# Patient Record
Sex: Male | Born: 1965 | Race: White | Hispanic: No | Marital: Married | Smoking: Never smoker
Health system: Southern US, Community
[De-identification: ages and names within clinical notes are randomized; demographics above are authoritative.]

## PROBLEM LIST (undated history)

## (undated) DIAGNOSIS — M109 Gout, unspecified: Secondary | ICD-10-CM

---

## 2002-03-16 ENCOUNTER — Emergency Department (HOSPITAL_COMMUNITY): Admission: EM | Admit: 2002-03-16 | Discharge: 2002-03-17 | Payer: Self-pay | Admitting: Emergency Medicine

## 2003-02-17 ENCOUNTER — Emergency Department (HOSPITAL_COMMUNITY): Admission: EM | Admit: 2003-02-17 | Discharge: 2003-02-18 | Payer: Self-pay | Admitting: Emergency Medicine

## 2003-02-18 ENCOUNTER — Encounter: Payer: Self-pay | Admitting: Emergency Medicine

## 2008-06-03 ENCOUNTER — Encounter: Admission: RE | Admit: 2008-06-03 | Discharge: 2008-06-03 | Payer: Self-pay | Admitting: Family Medicine

## 2010-06-20 IMAGING — CR DG RIBS 2V*L*
1 series · 4 of 4 positions shown · non-contrast
Comparison: None

CLINICAL DATA: Fall, left rib and sternal pain.

LEFT RIBS - 2 VIEW

[Series 1: view not recorded · 0.17mm/px · 4 of 4 slices shown]
[im 1/4]
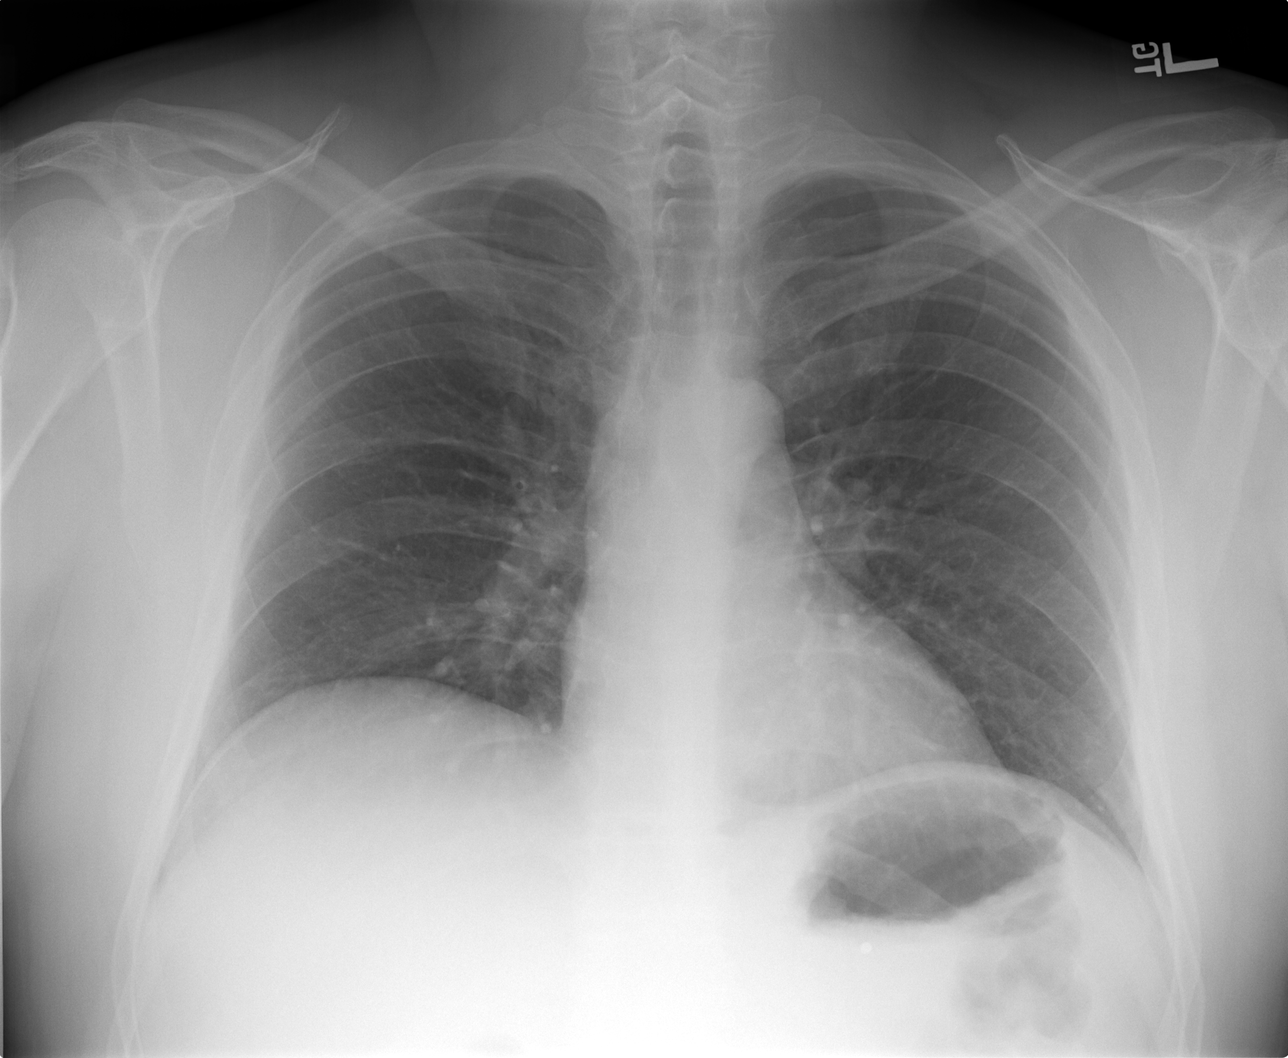
[im 2/4]
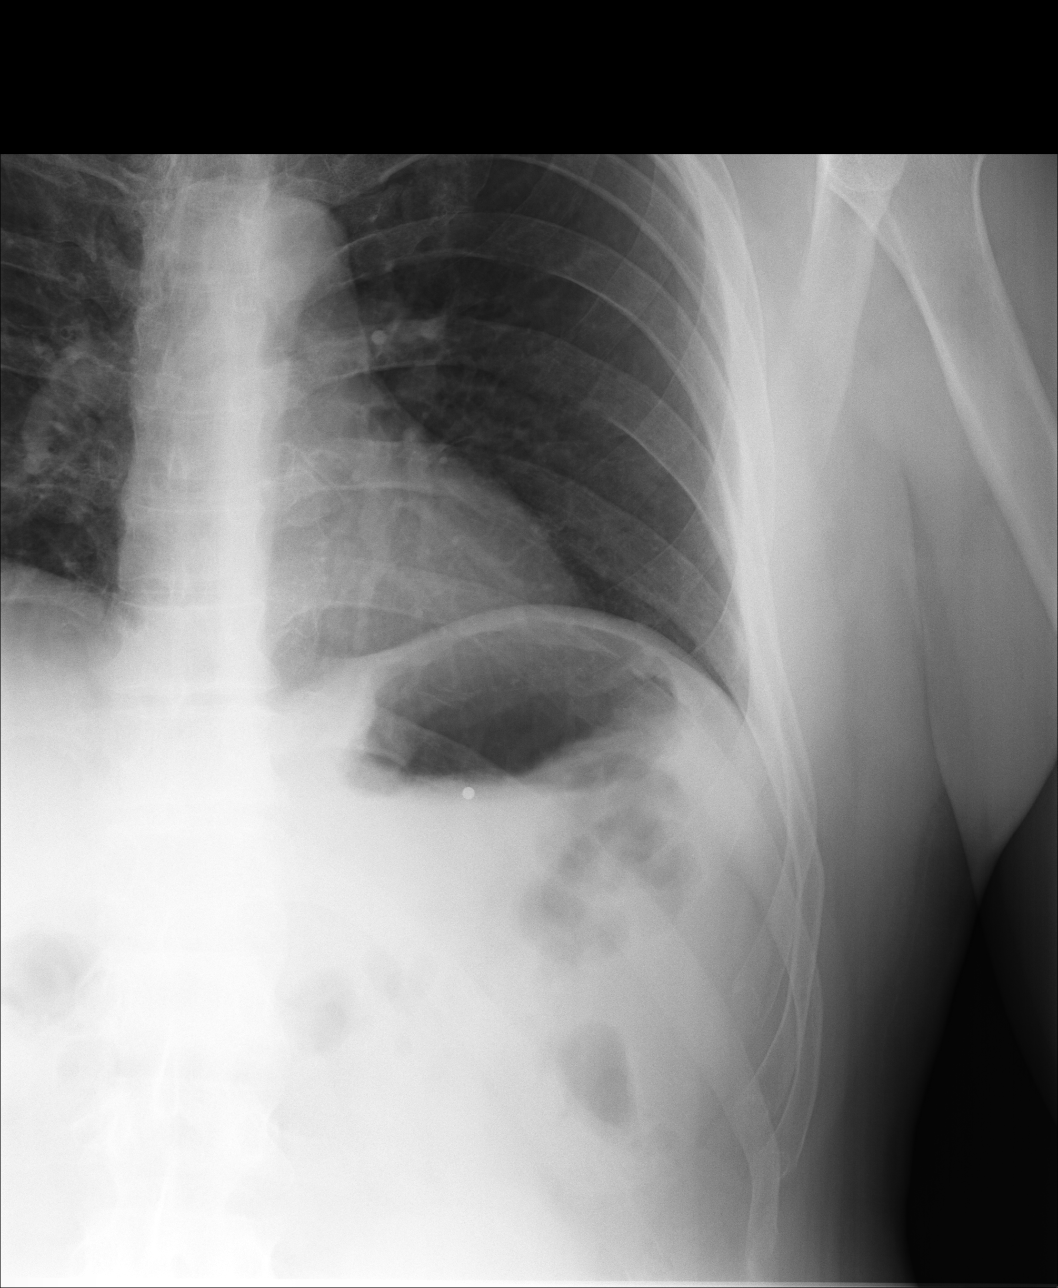
[im 3/4]
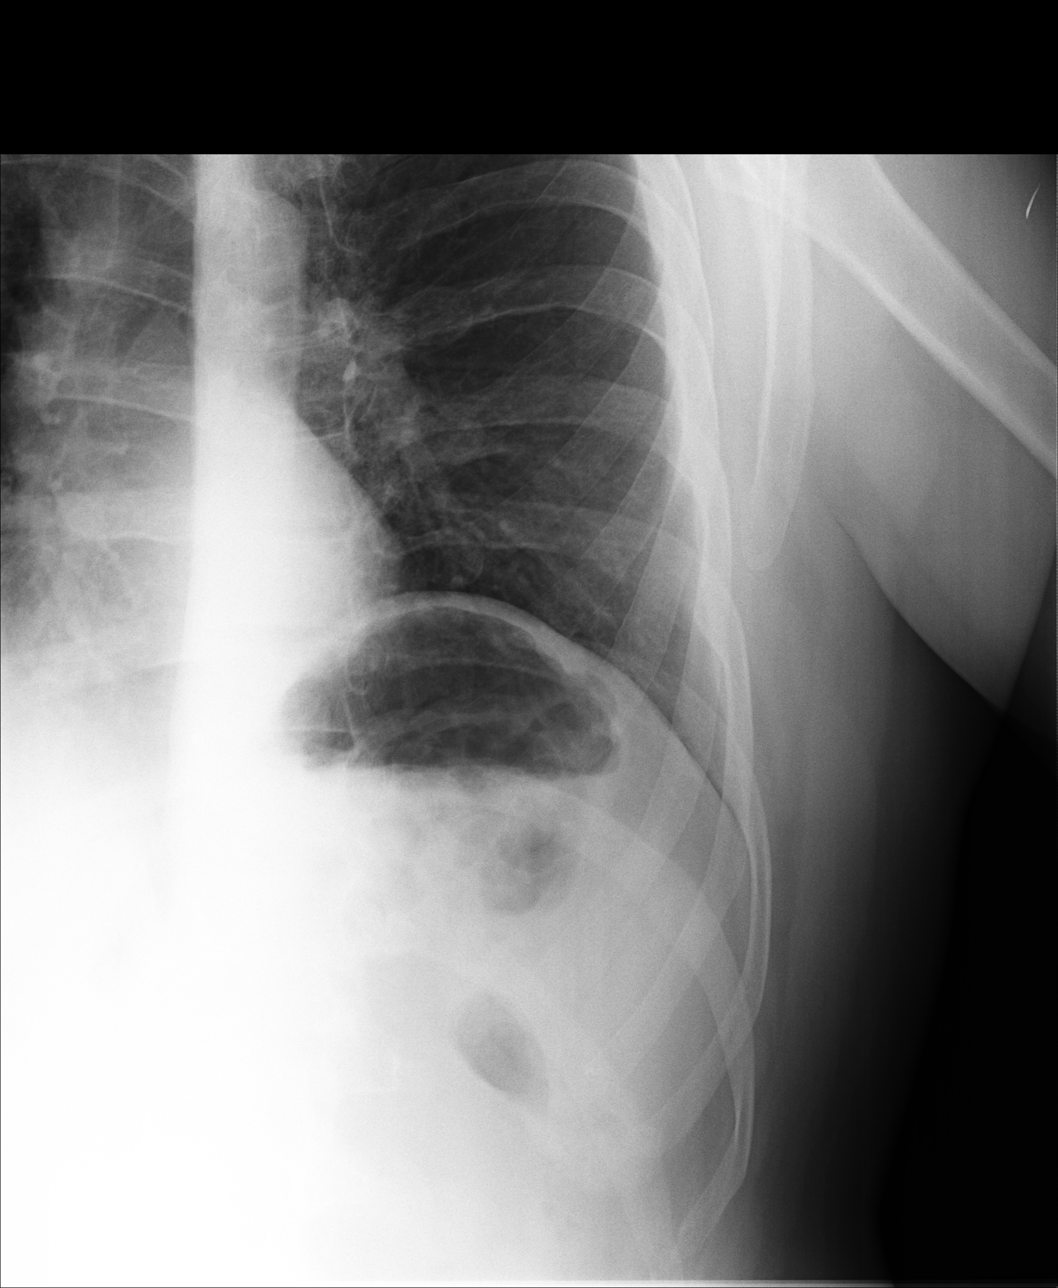
[im 4/4]
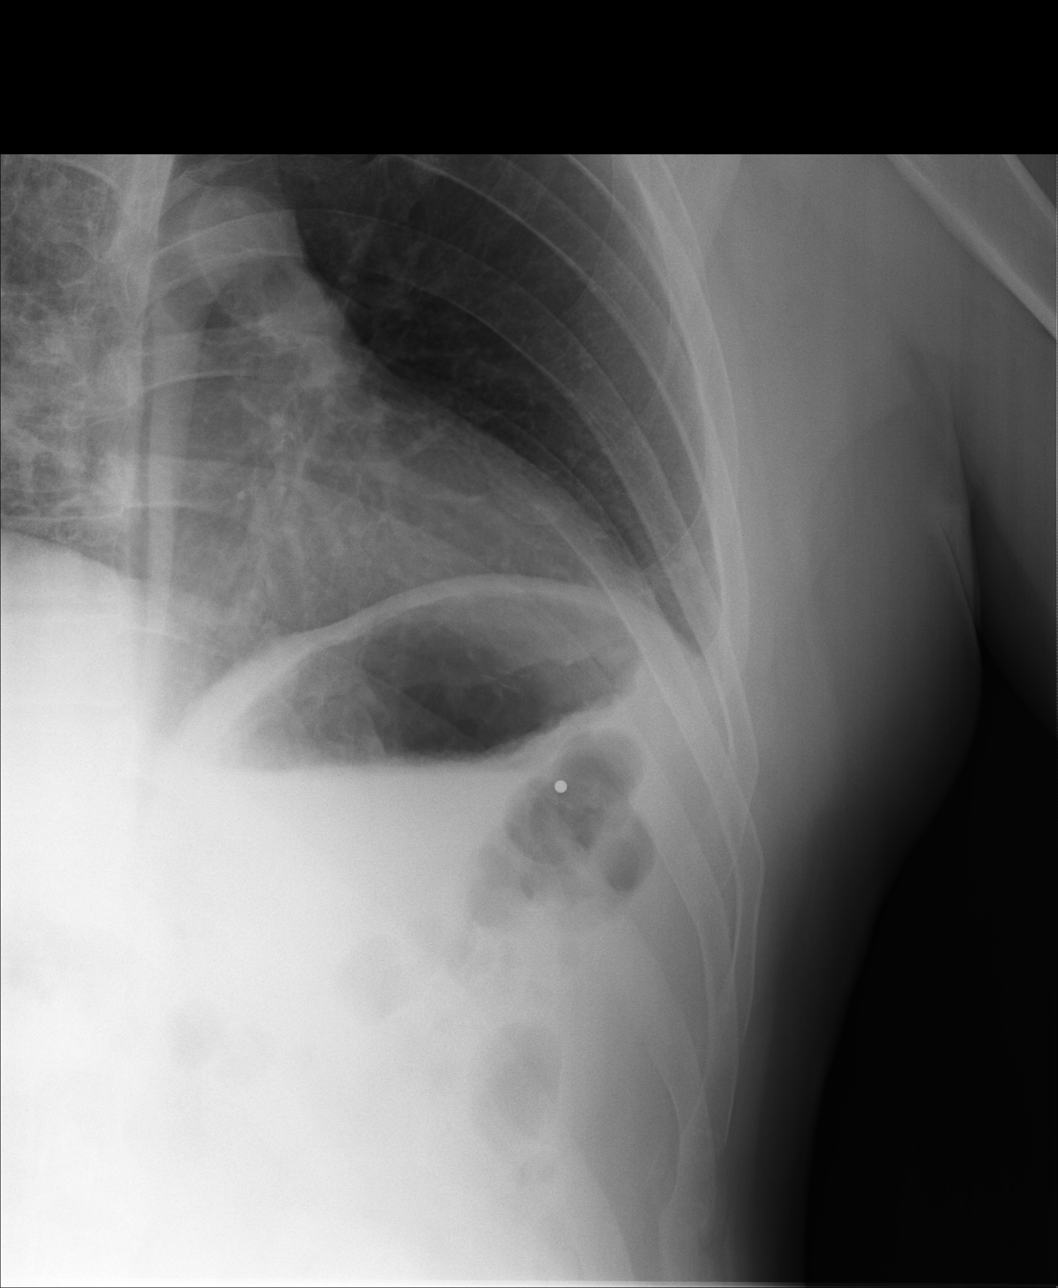

[4 of 4 positions shown; findings below may reference images not displayed]

FINDINGS: No acute bony abnormality.  Specifically no evidence of
left rib fracture.  Heart is normal size.  Lungs are clear.  No
pneumothorax or effusions.
IMPRESSION: No acute findings.

REF:G5 DICTATED: 06/03/2008 [DATE]
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
## 2014-12-10 ENCOUNTER — Emergency Department (HOSPITAL_BASED_OUTPATIENT_CLINIC_OR_DEPARTMENT_OTHER): Payer: BLUE CROSS/BLUE SHIELD

## 2014-12-10 ENCOUNTER — Encounter (HOSPITAL_BASED_OUTPATIENT_CLINIC_OR_DEPARTMENT_OTHER): Payer: Self-pay | Admitting: *Deleted

## 2014-12-10 ENCOUNTER — Emergency Department (HOSPITAL_BASED_OUTPATIENT_CLINIC_OR_DEPARTMENT_OTHER)
Admission: EM | Admit: 2014-12-10 | Discharge: 2014-12-10 | Disposition: A | Discharge status: Home / Self Care | Payer: BLUE CROSS/BLUE SHIELD | Attending: Emergency Medicine | Admitting: Emergency Medicine

## 2014-12-10 DIAGNOSIS — M109 Gout, unspecified: Secondary | ICD-10-CM | POA: Diagnosis not present

## 2014-12-10 DIAGNOSIS — R109 Unspecified abdominal pain: Secondary | ICD-10-CM | POA: Diagnosis present

## 2014-12-10 HISTORY — DX: Gout, unspecified: M10.9

## 2014-12-10 LAB — URINALYSIS, ROUTINE W REFLEX MICROSCOPIC
Bilirubin Urine: NEGATIVE
Glucose, UA: NEGATIVE mg/dL
Hgb urine dipstick: NEGATIVE
Ketones, ur: NEGATIVE mg/dL
Leukocytes, UA: NEGATIVE
Nitrite: NEGATIVE
Protein, ur: NEGATIVE mg/dL
Specific Gravity, Urine: 1.018 (ref 1.005–1.030)
Urobilinogen, UA: 1 mg/dL (ref 0.0–1.0)
pH: 6 (ref 5.0–8.0)

## 2014-12-10 LAB — COMPREHENSIVE METABOLIC PANEL
ALT: 286 U/L — ABNORMAL HIGH (ref 17–63)
AST: 90 U/L — ABNORMAL HIGH (ref 15–41)
Albumin: 3.4 g/dL — ABNORMAL LOW (ref 3.5–5.0)
Alkaline Phosphatase: 175 U/L — ABNORMAL HIGH (ref 38–126)
Anion gap: 8 (ref 5–15)
BUN: 16 mg/dL (ref 6–20)
CO2: 27 mmol/L (ref 22–32)
Calcium: 8.9 mg/dL (ref 8.9–10.3)
Chloride: 107 mmol/L (ref 101–111)
Creatinine, Ser: 0.98 mg/dL (ref 0.61–1.24)
GFR calc Af Amer: >60 mL/min (ref >60)
GFR calc non Af Amer: >60 mL/min (ref >60)
Glucose, Bld: 83 mg/dL (ref 65–99)
Potassium: 3.9 mmol/L (ref 3.5–5.1)
Sodium: 142 mmol/L (ref 135–145)
Total Bilirubin: 0.5 mg/dL (ref 0.3–1.2)
Total Protein: 6.5 g/dL (ref 6.5–8.1)

## 2014-12-10 NOTE — ED Notes (Signed)
Right flank pain x 3 days. No hx of kidney stones. Was seen last week with fever and may have lymes disease but states he is not here for that.

## 2014-12-10 NOTE — Discharge Instructions (Signed)
Flank Pain Your liver function tests are: AST-90 ALT-286 Alkaline phosphatase- 175. It is okay to take ibuprofen or Advil as directed for pain. Keep your scheduled appointment with at Dr Johnathan Hausen office next week. Continue doxycycline as prescribed.  Flank pain refers to pain that is located on the side of the body between the upper abdomen and the back. The pain may occur over a short period of time (acute) or may be long-term or reoccurring (chronic). It may be mild or severe. Flank pain can be caused by many things. CAUSES  Some of the more common causes of flank pain include:  Muscle strains.   Muscle spasms.   A disease of your spine (vertebral disk disease).   A lung infection (pneumonia).   Fluid around your lungs (pulmonary edema).   A kidney infection.   Kidney stones.   A very painful skin rash caused by the chickenpox virus (shingles).   Gallbladder disease.  HOME CARE INSTRUCTIONS  Home care will depend on the cause of your pain. In general,  Rest as directed by your caregiver.  Drink enough fluids to keep your urine clear or pale yellow.  Only take over-the-counter or prescription medicines as directed by your caregiver. Some medicines may help relieve the pain.  Tell your caregiver about any changes in your pain.  Follow up with your caregiver as directed. SEEK IMMEDIATE MEDICAL CARE IF:   Your pain is not controlled with medicine.   You have new or worsening symptoms.  Your pain increases.   You have abdominal pain.   You have shortness of breath.   You have persistent nausea or vomiting.   You have swelling in your abdomen.   You feel faint or pass out.   You have blood in your urine.  You have a fever or persistent symptoms for more than 2-3 days.  You have a fever and your symptoms suddenly get worse. MAKE SURE YOU:   Understand these instructions.  Will watch your condition.  Will get help right away if you are  not doing well or get worse. Document Released: 06/07/2005 Document Revised: 01/09/2012 Document Reviewed: 11/29/2011 [facility] Patient Information 2015 [place], [place]. This information is not intended to replace advice given to you by your health care provider. Make sure you discuss any questions you have with your health care provider.

## 2014-12-10 NOTE — ED Provider Notes (Addendum)
CSN: [id number]     Arrival date & time 12/10/14  1657 History  This chart was scribed for Doug Sou, MD by Placido Sou, ED scribe. This patient was seen in room MH10/MH10 and the patient's care was started at 6:05 PM.   Chief Complaint  Patient presents with  . Flank Pain   HPI  HPI Comments: Randall Ayala is a 49 y.o. male who presents to the Emergency Department complaining of constant, moderate, right flank pain with onset 2 days ago. Pt notes his pain worsens while lying and sleeping but denies any worsening of his pain when changing positions, ambulating or eating. He rates his current pain as 2/10 and describes it as dull. Pt also notes taking ibuprofen for pain management and experiencing some relief. He notes associated trouble sleeping. He notes that he was seen 6 days ago for fever (TMAX 103), chills, a rash to his left medial thigh, and body aches and further notes that those symptoms subsided about 4 days ago before the onset of his current flank pain. Pt notes that he has been taking his prescribed doxycycline since his previous visit which has helped alleviate his rash which started last week. Patient is treated with doxycycline for suspected Lyme's disease. Rash is improved markedly with time. He showed me a picture from his cell phone which was typical of erythema chronica migrans He denies any abd pain. No other associated symptoms.  Past Medical History  Diagnosis Date  . Gout    History reviewed. No pertinent past surgical history. No family history on file. Social History  Substance Use Topics  . Smoking status: Never Smoker   . Smokeless tobacco: None  . Alcohol Use: No    Review of Systems  Constitutional: Negative.   HENT: Negative.   Respiratory: Negative.   Cardiovascular: Negative.   Gastrointestinal: Negative.   Genitourinary: Positive for flank pain.  Musculoskeletal: Negative.   Skin: Positive for rash.  Neurological: Negative.    Psychiatric/Behavioral: Negative.   All other systems reviewed and are negative.   Allergies  Review of patient's allergies indicates no known allergies.  Home Medications   Prior to Admission medications   Medication Sig Start Date End Date Taking? Authorizing Provider  ALLOPURINOL PO Take by mouth.   Yes Historical Provider, MD   BP 124/84 mmHg  Pulse 58  Temp(Src) 98 F (36.7 C) (Oral)  Resp 18  Ht 6\' 2"  (1.88 m)  Wt 230 lb (104.327 kg)  BMI 29.52 kg/m2  SpO2 99% Physical Exam  Constitutional: He appears well-developed and well-nourished.  HENT:  Head: Normocephalic and atraumatic.  Eyes: Conjunctivae are normal. Pupils are equal, round, and reactive to light.  Neck: Neck supple. No tracheal deviation present. No thyromegaly present.  Cardiovascular: Normal rate and regular rhythm.   No murmur heard. Pulmonary/Chest: Effort normal and breath sounds normal.  Abdominal: Soft. Bowel sounds are normal. He exhibits no distension. There is no tenderness.  Musculoskeletal: Normal range of motion. He exhibits no edema or tenderness.  Neurological: He is alert. Coordination normal.  Skin: Skin is warm and dry. No rash noted.  Pinkish 20 cm area at left medial thigh. Not warm or tender or raised  Psychiatric: He has a normal mood and affect.  Nursing note and vitals reviewed.   ED Course  Procedures  DIAGNOSTIC STUDIES: Oxygen Saturation is 99% on RA, normal by my interpretation.    COORDINATION OF CARE: 6:17 PM Discussed treatment plan with pt at bedside and  pt agreed to plan.  Labs Review Labs Reviewed  URINALYSIS, ROUTINE W REFLEX MICROSCOPIC (NOT AT [facility])    Imaging Review No results found. I, Placido Sou, personally reviewed and evaluated these images and lab results as part of my medical decision-making.   EKG Interpretation None     Declines pain medicine  7:55 PM patient resting comfortably. Results for orders placed or performed during the  hospital encounter of 12/10/14  Urinalysis, Routine w reflex microscopic (not at [facility])  Result Value Ref Range   Color, Urine YELLOW YELLOW   APPearance CLEAR CLEAR   Specific Gravity, Urine 1.018 1.005 - 1.030   pH 6.0 5.0 - 8.0   Glucose, UA NEGATIVE NEGATIVE mg/dL   Hgb urine dipstick NEGATIVE NEGATIVE   Bilirubin Urine NEGATIVE NEGATIVE   Ketones, ur NEGATIVE NEGATIVE mg/dL   Protein, ur NEGATIVE NEGATIVE mg/dL   Urobilinogen, UA 1.0 0.0 - 1.0 mg/dL   Nitrite NEGATIVE NEGATIVE   Leukocytes, UA NEGATIVE NEGATIVE  Comprehensive metabolic panel  Result Value Ref Range   Sodium 142 135 - 145 mmol/L   Potassium 3.9 3.5 - 5.1 mmol/L   Chloride 107 101 - 111 mmol/L   CO2 27 22 - 32 mmol/L   Glucose, Bld 83 65 - 99 mg/dL   BUN 16 6 - 20 mg/dL   Creatinine, Ser 1.61 0.61 - 1.24 mg/dL   Calcium 8.9 8.9 - 09.6 mg/dL   Total Protein 6.5 6.5 - 8.1 g/dL   Albumin 3.4 (L) 3.5 - 5.0 g/dL   AST 90 (H) 15 - 41 U/L   ALT 286 (H) 17 - 63 U/L   Alkaline Phosphatase 175 (H) 38 - 126 U/L   Total Bilirubin 0.5 0.3 - 1.2 mg/dL   GFR calc non Af Amer >60 >60 mL/min   GFR calc Af Amer >60 >60 mL/min   Anion gap 8 5 - 15   Ct Renal Forsee Study  12/10/2014   CLINICAL DATA:  Acute right-sided flank pain.  EXAM: CT ABDOMEN AND PELVIS WITHOUT CONTRAST  TECHNIQUE: Multidetector CT imaging of the abdomen and pelvis was performed following the standard protocol without IV contrast.  COMPARISON:  None.  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 Visualized lung bases appear normal.  Cholelithiasis is noted. No focal abnormality is noted in the liver, spleen or pancreas on these unenhanced images. Adrenal glands appear normal. No hydronephrosis or renal obstruction is noted. No renal or ureteral calculi are noted. The appendix appears normal. Diverticulosis of descending and sigmoid colon is noted without inflammation. No abnormal fluid collection is noted. Urinary bladder appears normal. No significant  adenopathy is noted.  IMPRESSION: Cholelithiasis without definite evidence of inflammation.  No hydronephrosis or renal obstruction is noted.  Diverticulosis of descending and sigmoid colon is noted without inflammation.   Electronically Signed   By: Lupita Raider, M.D.   On: 12/10/2014 19:26    MDM  I suspect the patient had Lyme disease which is appropriately treated with doxycycline. No evidence of acute cholecystitis. Plan ibuprofen for pain. Keep scheduled appointment at Dr.Harris's office next week Diagnoses #1 right flank pain #2 elevated liver function tests #3 cholelithiasis Final diagnoses:  None      The patient's paper medical record is not available during this visit. It has been removed from this office and cannot be located.   Doug Sou, MD 12/10/14 Willette Alma, MD 12/10/14 [phone]

## 2014-12-10 NOTE — ED Notes (Signed)
MD at bedside. 

## 2016-01-18 ENCOUNTER — Ambulatory Visit (HOSPITAL_BASED_OUTPATIENT_CLINIC_OR_DEPARTMENT_OTHER): Admit: 2016-01-18 | Payer: BLUE CROSS/BLUE SHIELD | Admitting: Orthopedic Surgery

## 2016-01-18 ENCOUNTER — Encounter (HOSPITAL_BASED_OUTPATIENT_CLINIC_OR_DEPARTMENT_OTHER): Payer: Self-pay

## 2016-01-18 SURGERY — ARTHROSCOPY, KNEE, WITH MENISCUS REPAIR
Anesthesia: General | Site: Knee | Laterality: Left

## 2016-05-07 DIAGNOSIS — M159 Polyosteoarthritis, unspecified: Secondary | ICD-10-CM | POA: Diagnosis not present

## 2016-05-07 DIAGNOSIS — Z87891 Personal history of nicotine dependence: Secondary | ICD-10-CM | POA: Diagnosis not present

## 2016-05-07 DIAGNOSIS — Z79899 Other long term (current) drug therapy: Secondary | ICD-10-CM | POA: Diagnosis not present

## 2016-05-07 DIAGNOSIS — M1A09X0 Idiopathic chronic gout, multiple sites, without tophus (tophi): Secondary | ICD-10-CM | POA: Diagnosis not present

## 2016-11-12 DIAGNOSIS — Z87891 Personal history of nicotine dependence: Secondary | ICD-10-CM | POA: Diagnosis not present

## 2016-11-12 DIAGNOSIS — Z79899 Other long term (current) drug therapy: Secondary | ICD-10-CM | POA: Diagnosis not present

## 2016-11-12 DIAGNOSIS — M159 Polyosteoarthritis, unspecified: Secondary | ICD-10-CM | POA: Diagnosis not present

## 2016-11-12 DIAGNOSIS — M1A09X0 Idiopathic chronic gout, multiple sites, without tophus (tophi): Secondary | ICD-10-CM | POA: Diagnosis not present

## 2017-04-25 DIAGNOSIS — Z0000 Encounter for general adult medical examination without abnormal findings: Secondary | ICD-10-CM | POA: Diagnosis not present

## 2017-04-25 DIAGNOSIS — Z23 Encounter for immunization: Secondary | ICD-10-CM | POA: Diagnosis not present

## 2017-05-15 DIAGNOSIS — M1A09X1 Idiopathic chronic gout, multiple sites, with tophus (tophi): Secondary | ICD-10-CM | POA: Diagnosis not present

## 2017-05-15 DIAGNOSIS — Z79899 Other long term (current) drug therapy: Secondary | ICD-10-CM | POA: Diagnosis not present

## 2018-02-10 DIAGNOSIS — M1A09X1 Idiopathic chronic gout, multiple sites, with tophus (tophi): Secondary | ICD-10-CM | POA: Diagnosis not present

## 2018-02-10 DIAGNOSIS — M549 Dorsalgia, unspecified: Secondary | ICD-10-CM | POA: Diagnosis not present

## 2018-02-10 DIAGNOSIS — M1990 Unspecified osteoarthritis, unspecified site: Secondary | ICD-10-CM | POA: Diagnosis not present

## 2018-02-10 DIAGNOSIS — Z79899 Other long term (current) drug therapy: Secondary | ICD-10-CM | POA: Diagnosis not present

## 2018-02-24 DIAGNOSIS — H6501 Acute serous otitis media, right ear: Secondary | ICD-10-CM | POA: Diagnosis not present

## 2018-02-27 DIAGNOSIS — H9011 Conductive hearing loss, unilateral, right ear, with unrestricted hearing on the contralateral side: Secondary | ICD-10-CM | POA: Diagnosis not present

## 2018-02-27 DIAGNOSIS — H7291 Unspecified perforation of tympanic membrane, right ear: Secondary | ICD-10-CM | POA: Diagnosis not present

## 2018-02-27 DIAGNOSIS — H6122 Impacted cerumen, left ear: Secondary | ICD-10-CM | POA: Diagnosis not present

## 2018-04-17 DIAGNOSIS — Z0000 Encounter for general adult medical examination without abnormal findings: Secondary | ICD-10-CM | POA: Diagnosis not present

## 2018-04-17 DIAGNOSIS — B351 Tinea unguium: Secondary | ICD-10-CM | POA: Diagnosis not present

## 2018-04-17 DIAGNOSIS — Z23 Encounter for immunization: Secondary | ICD-10-CM | POA: Diagnosis not present

## 2018-04-17 DIAGNOSIS — Z1159 Encounter for screening for other viral diseases: Secondary | ICD-10-CM | POA: Diagnosis not present

## 2018-04-17 DIAGNOSIS — H6192 Disorder of left external ear, unspecified: Secondary | ICD-10-CM | POA: Diagnosis not present

## 2018-04-17 DIAGNOSIS — Z13220 Encounter for screening for lipoid disorders: Secondary | ICD-10-CM | POA: Diagnosis not present

## 2018-04-17 DIAGNOSIS — Z114 Encounter for screening for human immunodeficiency virus [HIV]: Secondary | ICD-10-CM | POA: Diagnosis not present

## 2018-05-14 DIAGNOSIS — D224 Melanocytic nevi of scalp and neck: Secondary | ICD-10-CM | POA: Diagnosis not present

## 2018-05-14 DIAGNOSIS — L570 Actinic keratosis: Secondary | ICD-10-CM | POA: Diagnosis not present

## 2018-05-14 DIAGNOSIS — L578 Other skin changes due to chronic exposure to nonionizing radiation: Secondary | ICD-10-CM | POA: Diagnosis not present

## 2018-05-15 DIAGNOSIS — D649 Anemia, unspecified: Secondary | ICD-10-CM | POA: Diagnosis not present

## 2018-05-15 DIAGNOSIS — Z79899 Other long term (current) drug therapy: Secondary | ICD-10-CM | POA: Diagnosis not present

## 2018-05-26 DIAGNOSIS — R079 Chest pain, unspecified: Secondary | ICD-10-CM | POA: Diagnosis not present

## 2018-05-28 DIAGNOSIS — R718 Other abnormality of red blood cells: Secondary | ICD-10-CM | POA: Diagnosis not present

## 2018-05-28 DIAGNOSIS — D509 Iron deficiency anemia, unspecified: Secondary | ICD-10-CM | POA: Diagnosis not present

## 2018-06-16 DIAGNOSIS — Z79899 Other long term (current) drug therapy: Secondary | ICD-10-CM | POA: Diagnosis not present

## 2018-07-14 DIAGNOSIS — Z79899 Other long term (current) drug therapy: Secondary | ICD-10-CM | POA: Diagnosis not present

## 2018-09-29 DIAGNOSIS — Z79899 Other long term (current) drug therapy: Secondary | ICD-10-CM | POA: Diagnosis not present

## 2018-09-29 DIAGNOSIS — M159 Polyosteoarthritis, unspecified: Secondary | ICD-10-CM | POA: Diagnosis not present

## 2018-09-29 DIAGNOSIS — M1A09X1 Idiopathic chronic gout, multiple sites, with tophus (tophi): Secondary | ICD-10-CM | POA: Diagnosis not present

## 2019-03-09 DIAGNOSIS — U071 COVID-19: Secondary | ICD-10-CM | POA: Diagnosis not present

## 2019-03-09 DIAGNOSIS — Z1159 Encounter for screening for other viral diseases: Secondary | ICD-10-CM | POA: Diagnosis not present

## 2019-03-30 DIAGNOSIS — M109 Gout, unspecified: Secondary | ICD-10-CM | POA: Diagnosis not present

## 2019-03-30 DIAGNOSIS — Z23 Encounter for immunization: Secondary | ICD-10-CM | POA: Diagnosis not present

## 2019-03-30 DIAGNOSIS — Z79899 Other long term (current) drug therapy: Secondary | ICD-10-CM | POA: Diagnosis not present

## 2019-03-30 DIAGNOSIS — M159 Polyosteoarthritis, unspecified: Secondary | ICD-10-CM | POA: Diagnosis not present

## 2019-03-30 DIAGNOSIS — M1990 Unspecified osteoarthritis, unspecified site: Secondary | ICD-10-CM | POA: Diagnosis not present

## 2019-03-30 DIAGNOSIS — M1A09X1 Idiopathic chronic gout, multiple sites, with tophus (tophi): Secondary | ICD-10-CM | POA: Diagnosis not present

## 2019-04-07 DIAGNOSIS — Z125 Encounter for screening for malignant neoplasm of prostate: Secondary | ICD-10-CM | POA: Diagnosis not present

## 2019-04-07 DIAGNOSIS — Z13220 Encounter for screening for lipoid disorders: Secondary | ICD-10-CM | POA: Diagnosis not present

## 2019-04-07 DIAGNOSIS — Z1159 Encounter for screening for other viral diseases: Secondary | ICD-10-CM | POA: Diagnosis not present

## 2019-04-07 DIAGNOSIS — Z0000 Encounter for general adult medical examination without abnormal findings: Secondary | ICD-10-CM | POA: Diagnosis not present

## 2019-04-07 DIAGNOSIS — E538 Deficiency of other specified B group vitamins: Secondary | ICD-10-CM | POA: Diagnosis not present

## 2019-04-07 DIAGNOSIS — M1A09X0 Idiopathic chronic gout, multiple sites, without tophus (tophi): Secondary | ICD-10-CM | POA: Diagnosis not present

## 2019-05-18 DIAGNOSIS — L814 Other melanin hyperpigmentation: Secondary | ICD-10-CM | POA: Diagnosis not present

## 2019-05-18 DIAGNOSIS — L821 Other seborrheic keratosis: Secondary | ICD-10-CM | POA: Diagnosis not present

## 2019-05-18 DIAGNOSIS — L578 Other skin changes due to chronic exposure to nonionizing radiation: Secondary | ICD-10-CM | POA: Diagnosis not present

## 2019-05-18 DIAGNOSIS — D225 Melanocytic nevi of trunk: Secondary | ICD-10-CM | POA: Diagnosis not present

## 2019-06-01 DIAGNOSIS — U071 COVID-19: Secondary | ICD-10-CM | POA: Diagnosis not present

## 2019-06-01 DIAGNOSIS — Z20828 Contact with and (suspected) exposure to other viral communicable diseases: Secondary | ICD-10-CM | POA: Diagnosis not present

## 2020-02-15 DIAGNOSIS — Z5181 Encounter for therapeutic drug level monitoring: Secondary | ICD-10-CM | POA: Diagnosis not present

## 2020-02-15 DIAGNOSIS — M159 Polyosteoarthritis, unspecified: Secondary | ICD-10-CM | POA: Diagnosis not present

## 2020-02-15 DIAGNOSIS — Z79899 Other long term (current) drug therapy: Secondary | ICD-10-CM | POA: Diagnosis not present

## 2020-02-15 DIAGNOSIS — M1990 Unspecified osteoarthritis, unspecified site: Secondary | ICD-10-CM | POA: Diagnosis not present

## 2020-02-15 DIAGNOSIS — M109 Gout, unspecified: Secondary | ICD-10-CM | POA: Diagnosis not present

## 2020-02-15 DIAGNOSIS — M1A09X1 Idiopathic chronic gout, multiple sites, with tophus (tophi): Secondary | ICD-10-CM | POA: Diagnosis not present

## 2020-04-07 DIAGNOSIS — Z0000 Encounter for general adult medical examination without abnormal findings: Secondary | ICD-10-CM | POA: Diagnosis not present

## 2020-04-07 DIAGNOSIS — Z13220 Encounter for screening for lipoid disorders: Secondary | ICD-10-CM | POA: Diagnosis not present

## 2020-04-07 DIAGNOSIS — E538 Deficiency of other specified B group vitamins: Secondary | ICD-10-CM | POA: Diagnosis not present

## 2020-04-07 DIAGNOSIS — Z125 Encounter for screening for malignant neoplasm of prostate: Secondary | ICD-10-CM | POA: Diagnosis not present

## 2020-05-18 DIAGNOSIS — L570 Actinic keratosis: Secondary | ICD-10-CM | POA: Diagnosis not present

## 2020-05-18 DIAGNOSIS — L814 Other melanin hyperpigmentation: Secondary | ICD-10-CM | POA: Diagnosis not present

## 2020-05-18 DIAGNOSIS — L821 Other seborrheic keratosis: Secondary | ICD-10-CM | POA: Diagnosis not present

## 2020-05-18 DIAGNOSIS — D1801 Hemangioma of skin and subcutaneous tissue: Secondary | ICD-10-CM | POA: Diagnosis not present

## 2020-05-18 DIAGNOSIS — L578 Other skin changes due to chronic exposure to nonionizing radiation: Secondary | ICD-10-CM | POA: Diagnosis not present

## 2020-10-13 DIAGNOSIS — M1712 Unilateral primary osteoarthritis, left knee: Secondary | ICD-10-CM | POA: Diagnosis not present

## 2020-10-27 DIAGNOSIS — Z1211 Encounter for screening for malignant neoplasm of colon: Secondary | ICD-10-CM | POA: Diagnosis not present

## 2020-10-27 DIAGNOSIS — D123 Benign neoplasm of transverse colon: Secondary | ICD-10-CM | POA: Diagnosis not present

## 2020-10-27 DIAGNOSIS — D125 Benign neoplasm of sigmoid colon: Secondary | ICD-10-CM | POA: Diagnosis not present

## 2020-10-27 DIAGNOSIS — K648 Other hemorrhoids: Secondary | ICD-10-CM | POA: Diagnosis not present

## 2020-10-27 DIAGNOSIS — K5730 Diverticulosis of large intestine without perforation or abscess without bleeding: Secondary | ICD-10-CM | POA: Diagnosis not present

## 2020-11-23 DIAGNOSIS — M130 Polyarthritis, unspecified: Secondary | ICD-10-CM | POA: Diagnosis not present

## 2020-11-23 DIAGNOSIS — M1A09X0 Idiopathic chronic gout, multiple sites, without tophus (tophi): Secondary | ICD-10-CM | POA: Diagnosis not present

## 2020-11-23 DIAGNOSIS — M7989 Other specified soft tissue disorders: Secondary | ICD-10-CM | POA: Diagnosis not present

## 2020-12-14 DIAGNOSIS — M25562 Pain in left knee: Secondary | ICD-10-CM | POA: Diagnosis not present

## 2020-12-29 DIAGNOSIS — S83242A Other tear of medial meniscus, current injury, left knee, initial encounter: Secondary | ICD-10-CM | POA: Diagnosis not present

## 2021-01-27 DIAGNOSIS — G8918 Other acute postprocedural pain: Secondary | ICD-10-CM | POA: Diagnosis not present

## 2021-01-27 DIAGNOSIS — M94262 Chondromalacia, left knee: Secondary | ICD-10-CM | POA: Diagnosis not present

## 2021-01-27 DIAGNOSIS — X58XXXA Exposure to other specified factors, initial encounter: Secondary | ICD-10-CM | POA: Diagnosis not present

## 2021-01-27 DIAGNOSIS — S83242A Other tear of medial meniscus, current injury, left knee, initial encounter: Secondary | ICD-10-CM | POA: Diagnosis not present

## 2021-01-27 DIAGNOSIS — Y999 Unspecified external cause status: Secondary | ICD-10-CM | POA: Diagnosis not present

## 2021-01-27 DIAGNOSIS — M23304 Other meniscus derangements, unspecified medial meniscus, left knee: Secondary | ICD-10-CM | POA: Diagnosis not present

## 2021-02-03 DIAGNOSIS — Z9889 Other specified postprocedural states: Secondary | ICD-10-CM | POA: Diagnosis not present

## 2021-02-03 DIAGNOSIS — M6281 Muscle weakness (generalized): Secondary | ICD-10-CM | POA: Diagnosis not present

## 2021-02-03 DIAGNOSIS — R2689 Other abnormalities of gait and mobility: Secondary | ICD-10-CM | POA: Diagnosis not present

## 2021-02-10 DIAGNOSIS — R2689 Other abnormalities of gait and mobility: Secondary | ICD-10-CM | POA: Diagnosis not present

## 2021-02-10 DIAGNOSIS — M6281 Muscle weakness (generalized): Secondary | ICD-10-CM | POA: Diagnosis not present

## 2021-02-10 DIAGNOSIS — Z9889 Other specified postprocedural states: Secondary | ICD-10-CM | POA: Diagnosis not present

## 2021-02-21 DIAGNOSIS — R2689 Other abnormalities of gait and mobility: Secondary | ICD-10-CM | POA: Diagnosis not present

## 2021-02-21 DIAGNOSIS — Z9889 Other specified postprocedural states: Secondary | ICD-10-CM | POA: Diagnosis not present

## 2021-02-21 DIAGNOSIS — M6281 Muscle weakness (generalized): Secondary | ICD-10-CM | POA: Diagnosis not present

## 2021-03-01 DIAGNOSIS — M6281 Muscle weakness (generalized): Secondary | ICD-10-CM | POA: Diagnosis not present

## 2021-03-01 DIAGNOSIS — R2689 Other abnormalities of gait and mobility: Secondary | ICD-10-CM | POA: Diagnosis not present

## 2021-03-01 DIAGNOSIS — Z9889 Other specified postprocedural states: Secondary | ICD-10-CM | POA: Diagnosis not present

## 2021-04-13 DIAGNOSIS — Z13220 Encounter for screening for lipoid disorders: Secondary | ICD-10-CM | POA: Diagnosis not present

## 2021-04-13 DIAGNOSIS — Z0000 Encounter for general adult medical examination without abnormal findings: Secondary | ICD-10-CM | POA: Diagnosis not present

## 2021-05-19 DIAGNOSIS — L821 Other seborrheic keratosis: Secondary | ICD-10-CM | POA: Diagnosis not present

## 2021-05-19 DIAGNOSIS — D225 Melanocytic nevi of trunk: Secondary | ICD-10-CM | POA: Diagnosis not present

## 2021-05-19 DIAGNOSIS — D2271 Melanocytic nevi of right lower limb, including hip: Secondary | ICD-10-CM | POA: Diagnosis not present

## 2021-05-19 DIAGNOSIS — L578 Other skin changes due to chronic exposure to nonionizing radiation: Secondary | ICD-10-CM | POA: Diagnosis not present

## 2021-05-25 DIAGNOSIS — M150 Primary generalized (osteo)arthritis: Secondary | ICD-10-CM | POA: Diagnosis not present

## 2021-05-25 DIAGNOSIS — M1A09X0 Idiopathic chronic gout, multiple sites, without tophus (tophi): Secondary | ICD-10-CM | POA: Diagnosis not present

## 2021-07-24 DIAGNOSIS — H40023 Open angle with borderline findings, high risk, bilateral: Secondary | ICD-10-CM | POA: Diagnosis not present

## 2021-07-24 DIAGNOSIS — H43313 Vitreous membranes and strands, bilateral: Secondary | ICD-10-CM | POA: Diagnosis not present

## 2021-09-18 DIAGNOSIS — H40023 Open angle with borderline findings, high risk, bilateral: Secondary | ICD-10-CM | POA: Diagnosis not present

## 2021-11-28 DIAGNOSIS — M1A09X0 Idiopathic chronic gout, multiple sites, without tophus (tophi): Secondary | ICD-10-CM | POA: Diagnosis not present

## 2021-11-28 DIAGNOSIS — M1991 Primary osteoarthritis, unspecified site: Secondary | ICD-10-CM | POA: Diagnosis not present

## 2022-03-28 DIAGNOSIS — Z125 Encounter for screening for malignant neoplasm of prostate: Secondary | ICD-10-CM | POA: Diagnosis not present

## 2022-03-28 DIAGNOSIS — Z0000 Encounter for general adult medical examination without abnormal findings: Secondary | ICD-10-CM | POA: Diagnosis not present

## 2022-03-28 DIAGNOSIS — Z13220 Encounter for screening for lipoid disorders: Secondary | ICD-10-CM | POA: Diagnosis not present

## 2022-04-12 DIAGNOSIS — M7052 Other bursitis of knee, left knee: Secondary | ICD-10-CM | POA: Diagnosis not present

## 2022-05-04 DIAGNOSIS — R7989 Other specified abnormal findings of blood chemistry: Secondary | ICD-10-CM | POA: Diagnosis not present

## 2022-05-04 DIAGNOSIS — R945 Abnormal results of liver function studies: Secondary | ICD-10-CM | POA: Diagnosis not present

## 2022-05-21 DIAGNOSIS — L853 Xerosis cutis: Secondary | ICD-10-CM | POA: Diagnosis not present

## 2022-05-21 DIAGNOSIS — D225 Melanocytic nevi of trunk: Secondary | ICD-10-CM | POA: Diagnosis not present

## 2022-05-21 DIAGNOSIS — L821 Other seborrheic keratosis: Secondary | ICD-10-CM | POA: Diagnosis not present

## 2022-05-21 DIAGNOSIS — L814 Other melanin hyperpigmentation: Secondary | ICD-10-CM | POA: Diagnosis not present

## 2022-05-31 DIAGNOSIS — M1991 Primary osteoarthritis, unspecified site: Secondary | ICD-10-CM | POA: Diagnosis not present

## 2022-05-31 DIAGNOSIS — M1A09X0 Idiopathic chronic gout, multiple sites, without tophus (tophi): Secondary | ICD-10-CM | POA: Diagnosis not present

## 2022-05-31 DIAGNOSIS — R7989 Other specified abnormal findings of blood chemistry: Secondary | ICD-10-CM | POA: Diagnosis not present

## 2022-11-29 DIAGNOSIS — M1A09X0 Idiopathic chronic gout, multiple sites, without tophus (tophi): Secondary | ICD-10-CM | POA: Diagnosis not present

## 2022-11-29 DIAGNOSIS — R7989 Other specified abnormal findings of blood chemistry: Secondary | ICD-10-CM | POA: Diagnosis not present

## 2022-11-29 DIAGNOSIS — M1991 Primary osteoarthritis, unspecified site: Secondary | ICD-10-CM | POA: Diagnosis not present

## 2022-12-06 DIAGNOSIS — R42 Dizziness and giddiness: Secondary | ICD-10-CM | POA: Diagnosis not present

## 2022-12-06 DIAGNOSIS — F0781 Postconcussional syndrome: Secondary | ICD-10-CM | POA: Diagnosis not present

## 2022-12-06 DIAGNOSIS — R519 Headache, unspecified: Secondary | ICD-10-CM | POA: Diagnosis not present

## 2023-03-25 DIAGNOSIS — Z13220 Encounter for screening for lipoid disorders: Secondary | ICD-10-CM | POA: Diagnosis not present

## 2023-03-25 DIAGNOSIS — R0789 Other chest pain: Secondary | ICD-10-CM | POA: Diagnosis not present

## 2023-03-25 DIAGNOSIS — Z0000 Encounter for general adult medical examination without abnormal findings: Secondary | ICD-10-CM | POA: Diagnosis not present

## 2023-03-25 DIAGNOSIS — Z125 Encounter for screening for malignant neoplasm of prostate: Secondary | ICD-10-CM | POA: Diagnosis not present

## 2023-03-25 DIAGNOSIS — M109 Gout, unspecified: Secondary | ICD-10-CM | POA: Diagnosis not present

## 2023-05-06 DIAGNOSIS — N6342 Unspecified lump in left breast, subareolar: Secondary | ICD-10-CM | POA: Diagnosis not present

## 2023-05-06 DIAGNOSIS — R030 Elevated blood-pressure reading, without diagnosis of hypertension: Secondary | ICD-10-CM | POA: Diagnosis not present

## 2023-05-06 DIAGNOSIS — E669 Obesity, unspecified: Secondary | ICD-10-CM | POA: Diagnosis not present

## 2023-05-06 DIAGNOSIS — F439 Reaction to severe stress, unspecified: Secondary | ICD-10-CM | POA: Diagnosis not present

## 2023-05-07 ENCOUNTER — Other Ambulatory Visit: Payer: Self-pay | Admitting: Family Medicine

## 2023-05-07 DIAGNOSIS — N6342 Unspecified lump in left breast, subareolar: Secondary | ICD-10-CM

## 2023-06-04 ENCOUNTER — Ambulatory Visit: Payer: BLUE CROSS/BLUE SHIELD

## 2023-06-04 ENCOUNTER — Ambulatory Visit
Admission: RE | Admit: 2023-06-04 | Discharge: 2023-06-04 | Disposition: A | Discharge status: Home / Self Care | Payer: BC Managed Care – PPO | Source: Ambulatory Visit | Attending: Family Medicine | Admitting: Family Medicine

## 2023-06-04 DIAGNOSIS — N62 Hypertrophy of breast: Secondary | ICD-10-CM | POA: Diagnosis not present

## 2023-06-04 DIAGNOSIS — N6342 Unspecified lump in left breast, subareolar: Secondary | ICD-10-CM

## 2023-06-13 DIAGNOSIS — M1A09X0 Idiopathic chronic gout, multiple sites, without tophus (tophi): Secondary | ICD-10-CM | POA: Diagnosis not present

## 2023-06-13 DIAGNOSIS — R7989 Other specified abnormal findings of blood chemistry: Secondary | ICD-10-CM | POA: Diagnosis not present

## 2023-06-13 DIAGNOSIS — M1991 Primary osteoarthritis, unspecified site: Secondary | ICD-10-CM | POA: Diagnosis not present

## 2023-08-05 DIAGNOSIS — L814 Other melanin hyperpigmentation: Secondary | ICD-10-CM | POA: Diagnosis not present

## 2023-08-05 DIAGNOSIS — Z1283 Encounter for screening for malignant neoplasm of skin: Secondary | ICD-10-CM | POA: Diagnosis not present

## 2023-08-05 DIAGNOSIS — D225 Melanocytic nevi of trunk: Secondary | ICD-10-CM | POA: Diagnosis not present

## 2023-08-05 DIAGNOSIS — L821 Other seborrheic keratosis: Secondary | ICD-10-CM | POA: Diagnosis not present

## 2023-12-20 DIAGNOSIS — Z09 Encounter for follow-up examination after completed treatment for conditions other than malignant neoplasm: Secondary | ICD-10-CM | POA: Diagnosis not present

## 2023-12-20 DIAGNOSIS — K635 Polyp of colon: Secondary | ICD-10-CM | POA: Diagnosis not present

## 2023-12-20 DIAGNOSIS — D124 Benign neoplasm of descending colon: Secondary | ICD-10-CM | POA: Diagnosis not present

## 2023-12-20 DIAGNOSIS — K5730 Diverticulosis of large intestine without perforation or abscess without bleeding: Secondary | ICD-10-CM | POA: Diagnosis not present

## 2023-12-20 DIAGNOSIS — Z860101 Personal history of adenomatous and serrated colon polyps: Secondary | ICD-10-CM | POA: Diagnosis not present

## 2024-01-08 DIAGNOSIS — R519 Headache, unspecified: Secondary | ICD-10-CM | POA: Diagnosis not present

## 2024-01-14 ENCOUNTER — Other Ambulatory Visit: Payer: Self-pay | Admitting: Family Medicine

## 2024-01-14 DIAGNOSIS — R519 Headache, unspecified: Secondary | ICD-10-CM

## 2024-03-30 DIAGNOSIS — Z808 Family history of malignant neoplasm of other organs or systems: Secondary | ICD-10-CM | POA: Diagnosis not present

## 2024-03-30 DIAGNOSIS — M722 Plantar fascial fibromatosis: Secondary | ICD-10-CM | POA: Diagnosis not present

## 2024-03-30 DIAGNOSIS — Z87891 Personal history of nicotine dependence: Secondary | ICD-10-CM | POA: Diagnosis not present

## 2024-03-30 DIAGNOSIS — M109 Gout, unspecified: Secondary | ICD-10-CM | POA: Diagnosis not present

## 2024-03-30 DIAGNOSIS — Z125 Encounter for screening for malignant neoplasm of prostate: Secondary | ICD-10-CM | POA: Diagnosis not present

## 2024-03-30 DIAGNOSIS — Z0000 Encounter for general adult medical examination without abnormal findings: Secondary | ICD-10-CM | POA: Diagnosis not present

## 2024-03-30 DIAGNOSIS — Z13220 Encounter for screening for lipoid disorders: Secondary | ICD-10-CM | POA: Diagnosis not present

## 2024-04-09 ENCOUNTER — Telehealth: Payer: Self-pay | Admitting: *Deleted

## 2024-04-09 ENCOUNTER — Ambulatory Visit

## 2024-04-09 ENCOUNTER — Encounter: Payer: Self-pay | Admitting: Podiatry

## 2024-04-09 ENCOUNTER — Ambulatory Visit: Admitting: Podiatry

## 2024-04-09 DIAGNOSIS — B351 Tinea unguium: Secondary | ICD-10-CM | POA: Diagnosis not present

## 2024-04-09 DIAGNOSIS — M722 Plantar fascial fibromatosis: Secondary | ICD-10-CM

## 2024-04-09 DIAGNOSIS — M79672 Pain in left foot: Secondary | ICD-10-CM | POA: Diagnosis not present

## 2024-04-09 DIAGNOSIS — M19072 Primary osteoarthritis, left ankle and foot: Secondary | ICD-10-CM | POA: Diagnosis not present

## 2024-04-09 DIAGNOSIS — M7732 Calcaneal spur, left foot: Secondary | ICD-10-CM | POA: Diagnosis not present

## 2024-04-09 MED ORDER — MELOXICAM 15 MG PO TABS: 15.0000 mg | ORAL_TABLET | Freq: Every day | ORAL | 0 refills | Status: AC

## 2024-04-09 NOTE — Progress Notes (Signed)
°  Subjective:  Patient ID: Randall Ayala, male    DOB: Jul 14, 1965,   MRN: [id number]  Chief Complaint  Patient presents with   Plantar Fasciitis    Plantar fasciitis, I've had it before. (Left)   Nail Problem    I want to talk to her about toenail fungus too.  I've had it for years, I can't get rid of it.  It's on about eight of my toes.    58 y.o. male presents for concern of plantar fasciitis on the left foot.  He relates he has had this in the past and has had it resolved.  Relates it has been bothering him the last several months.  He has been doing some massaging and scraping of the muscles as well as taking ibuprofen to help some.  He also relates concern for fungus on his nails.  Relates has been ongoing for years.  He has tried different over-the-counter medications and has tried Lamisil but has come back.. Denies any other pedal complaints. Denies n/v/f/c.   Past Medical History:  Diagnosis Date   Gout     Objective:  Physical Exam: Vascular: DP/PT pulses 2/4 bilateral. CFT <3 seconds. Normal hair growth on digits. No edema.  Skin. No lacerations or abrasions bilateral feet.  Nails 1 through 5 bilateral thickened dystrophic and discolored with subungual debris. Musculoskeletal: MMT 5/5 bilateral lower extremities in DF, PF, Inversion and Eversion. Deceased ROM in DF of ankle joint. Tender to the medial calcaneal tubercle left . No pain with achilles, PT or arch. No pain with calcaneal squeeze.  Neurological: Sensation intact to light touch.   Assessment:   1. Onychomycosis   2. Plantar fasciitis of left foot      Plan:  Patient was evaluated and treated and all questions answered. Discussed plantar fasciitis with patient.  X-rays reviewed and discussed with patient. No acute fractures or dislocations noted. Mild spurring noted at inferior calcaneus.  Discussed treatment options including, ice, NSAIDS, supportive shoes, bracing, and stretching. Stretching exercises  provided to be done on a daily basis.   Prescription for meloxicam provided and sent to pharmacy.  Kidney function labs within normal limits. Pf brace dispensed.  Follow-up 6 weeks or sooner if any problems arise. In the meantime, encouraged to call the office with any questions, concerns, change in symptoms.   -Examined patient -Discussed treatment options for painful dystrophic nails  -Clinical picture and Fungal culture was obtained by removing a portion of the hard nail itself from each of the involved toenails using a sterile nail nipper and sent to [facility] lab. Patient tolerated the biopsy procedure well without discomfort or need for anesthesia.  -Discussed fungal nail treatment options including oral, topical, and laser treatments.  -Patient to return in 6 weeks for follow up evaluation and discussion of fungal culture results or sooner if symptoms worsen.       Asberry Failing, DPM

## 2024-04-09 NOTE — Patient Instructions (Signed)

## 2024-04-09 NOTE — Telephone Encounter (Signed)
 I called and left a message asking the patient to arrive at 1:10 pm for his appointment.  I informed him that we need xrays of his foot.  I asked him to stop by imaging first then come to us  for his appointment.

## 2024-04-21 ENCOUNTER — Other Ambulatory Visit: Payer: Self-pay | Admitting: Podiatry

## 2024-05-21 ENCOUNTER — Encounter: Payer: Self-pay | Admitting: Podiatry

## 2024-05-21 ENCOUNTER — Ambulatory Visit (INDEPENDENT_AMBULATORY_CARE_PROVIDER_SITE_OTHER): Admitting: Podiatry

## 2024-05-21 DIAGNOSIS — B351 Tinea unguium: Secondary | ICD-10-CM

## 2024-05-21 DIAGNOSIS — M722 Plantar fascial fibromatosis: Secondary | ICD-10-CM | POA: Diagnosis not present

## 2024-05-21 MED ORDER — TERBINAFINE HCL 250 MG PO TABS: 250.0000 mg | ORAL_TABLET | Freq: Every day | ORAL | 0 refills | Status: AC

## 2024-05-21 NOTE — Patient Instructions (Signed)

## 2024-05-21 NOTE — Progress Notes (Signed)
"  °  Subjective:  Patient ID: Randall Ayala, male    DOB: [birth year],   MRN: [id number]  Chief Complaint  Patient presents with   Nail Problem    I am here to find out the treatment for my nails.   Plantar Fasciitis    It still bothers me but not as bad with this little brace that I got.    59 y.o. male presents for follow-up of left plantar fasciitis and fungal nails.  Here to discuss culture results and plantar fasciitis.  He relates his heel is doing about 50% better.  He has been wearing the brace.  He relates he did not do the stretching exercises.  He did take the meloxicam  and that seemed to help as well.  Denies any other pedal complaints. Denies n/v/f/c.   Past Medical History:  Diagnosis Date   Gout     Objective:  Physical Exam: Vascular: DP/PT pulses 2/4 bilateral. CFT <3 seconds. Normal hair growth on digits. No edema.  Skin. No lacerations or abrasions bilateral feet.  Nails 1 through 5 bilateral thickened dystrophic and discolored with subungual debris. Musculoskeletal: MMT 5/5 bilateral lower extremities in DF, PF, Inversion and Eversion. Deceased ROM in DF of ankle joint.  Minimally tender to the medial calcaneal tubercle left . No pain with achilles, PT or arch. No pain with calcaneal squeeze.  Neurological: Sensation intact to light touch.   Assessment:   1. Plantar fasciitis of left foot   2. Onychomycosis      Plan:  Patient was evaluated and treated and all questions answered. Discussed plantar fasciitis with patient.  X-rays reviewed and discussed with patient. No acute fractures or dislocations noted. Mild spurring noted at inferior calcaneus.  Discussed treatment options including, ice, NSAIDS, supportive shoes, bracing, and stretching.  Continue stretching exercises. Continue brace as needed. Continue anti-inflammatories as needed.  -Examined patient -Discussed treatment options for painful dystrophic nails  - Positive for fungus. -Discussed fungal  nail treatment options including oral, topical, and laser treatments.  -Opts for Lamisil  treatment.  LFTs reviewed in the past and normal.  90 days sent to pharmacy. -Patient to return in 3 months for recheck      Asberry Failing, DPM    "

## 2024-08-20 ENCOUNTER — Ambulatory Visit: Admitting: Podiatry
